# Patient Record
Sex: Male | Born: 1995 | Race: Black or African American | Hispanic: No | Marital: Single | Smoking: Current some day smoker
Health system: Southern US, Community
[De-identification: ages and names within clinical notes are randomized; demographics above are authoritative.]

## PROBLEM LIST (undated history)

## (undated) HISTORY — PX: KNEE SURGERY: SHX244

---

## 2017-09-11 ENCOUNTER — Emergency Department (HOSPITAL_COMMUNITY)
Admission: EM | Admit: 2017-09-11 | Discharge: 2017-09-12 | Disposition: A | Discharge status: Home / Self Care | Payer: PRIVATE HEALTH INSURANCE | Attending: Emergency Medicine | Admitting: Emergency Medicine

## 2017-09-11 ENCOUNTER — Encounter (HOSPITAL_COMMUNITY): Payer: Self-pay | Admitting: Emergency Medicine

## 2017-09-11 DIAGNOSIS — R1110 Vomiting, unspecified: Secondary | ICD-10-CM | POA: Diagnosis present

## 2017-09-11 DIAGNOSIS — R112 Nausea with vomiting, unspecified: Secondary | ICD-10-CM

## 2017-09-11 DIAGNOSIS — R197 Diarrhea, unspecified: Secondary | ICD-10-CM | POA: Diagnosis not present

## 2017-09-11 DIAGNOSIS — R200 Anesthesia of skin: Secondary | ICD-10-CM | POA: Diagnosis not present

## 2017-09-11 DIAGNOSIS — R531 Weakness: Secondary | ICD-10-CM | POA: Diagnosis not present

## 2017-09-11 LAB — COMPREHENSIVE METABOLIC PANEL
ALT: 15 U/L (ref 0–44)
ANION GAP: 8 (ref 5–15)
AST: 20 U/L (ref 15–41)
Albumin: 4.3 g/dL (ref 3.5–5.0)
Alkaline Phosphatase: 100 U/L (ref 38–126)
BUN: 13 mg/dL (ref 6–20)
CHLORIDE: 103 mmol/L (ref 98–111)
CO2: 26 mmol/L (ref 22–32)
Calcium: 9.4 mg/dL (ref 8.9–10.3)
Creatinine, Ser: 1.07 mg/dL (ref 0.61–1.24)
GFR calc Af Amer: >60 mL/min (ref >60)
GFR calc non Af Amer: >60 mL/min (ref >60)
GLUCOSE: 107 mg/dL — AB (ref 70–99)
POTASSIUM: 3.9 mmol/L (ref 3.5–5.1)
SODIUM: 137 mmol/L (ref 135–145)
Total Bilirubin: 1 mg/dL (ref 0.3–1.2)
Total Protein: 7 g/dL (ref 6.5–8.1)

## 2017-09-11 LAB — URINALYSIS, ROUTINE W REFLEX MICROSCOPIC
BILIRUBIN URINE: NEGATIVE
Glucose, UA: NEGATIVE mg/dL
HGB URINE DIPSTICK: NEGATIVE
KETONES UR: NEGATIVE mg/dL
Leukocytes, UA: NEGATIVE
NITRITE: NEGATIVE
Protein, ur: NEGATIVE mg/dL
SPECIFIC GRAVITY, URINE: 1.011 (ref 1.005–1.030)
pH: 8 (ref 5.0–8.0)

## 2017-09-11 LAB — CBC
HCT: 41 % (ref 39.0–52.0)
Hemoglobin: 13.7 g/dL (ref 13.0–17.0)
MCH: 29.6 pg (ref 26.0–34.0)
MCHC: 33.4 g/dL (ref 30.0–36.0)
MCV: 88.6 fL (ref 78.0–100.0)
Platelets: 213 10*3/uL (ref 150–400)
RBC: 4.63 MIL/uL (ref 4.22–5.81)
RDW: 11.8 % (ref 11.5–15.5)
WBC: 9.9 10*3/uL (ref 4.0–10.5)

## 2017-09-11 LAB — LIPASE, BLOOD: LIPASE: 29 U/L (ref 11–51)

## 2017-09-11 MED ORDER — ONDANSETRON 4 MG PO TBDP
4.0000 mg | ORAL_TABLET | Freq: Once | ORAL | Status: AC
  Administered 2017-09-11: 4 mg via ORAL
  Filled 2017-09-11: qty 1

## 2017-09-11 NOTE — ED Triage Notes (Signed)
BIB EMS from home, pt reports N/V/D onset this evening. Pt believes he has food poisoning.

## 2017-09-12 ENCOUNTER — Emergency Department (EMERGENCY_DEPARTMENT_HOSPITAL)
Admission: EM | Admit: 2017-09-12 | Discharge: 2017-09-13 | Disposition: A | Discharge status: Home / Self Care | Payer: PRIVATE HEALTH INSURANCE | Source: Home / Self Care | Attending: Emergency Medicine | Admitting: Emergency Medicine

## 2017-09-12 ENCOUNTER — Other Ambulatory Visit: Payer: Self-pay

## 2017-09-12 ENCOUNTER — Emergency Department (HOSPITAL_COMMUNITY): Payer: PRIVATE HEALTH INSURANCE

## 2017-09-12 ENCOUNTER — Encounter (HOSPITAL_COMMUNITY): Payer: Self-pay | Admitting: Emergency Medicine

## 2017-09-12 DIAGNOSIS — R200 Anesthesia of skin: Secondary | ICD-10-CM | POA: Insufficient documentation

## 2017-09-12 DIAGNOSIS — R251 Tremor, unspecified: Secondary | ICD-10-CM

## 2017-09-12 DIAGNOSIS — R531 Weakness: Secondary | ICD-10-CM | POA: Insufficient documentation

## 2017-09-12 DIAGNOSIS — R112 Nausea with vomiting, unspecified: Secondary | ICD-10-CM | POA: Diagnosis not present

## 2017-09-12 LAB — COMPREHENSIVE METABOLIC PANEL
ALBUMIN: 4.5 g/dL (ref 3.5–5.0)
ALT: 15 U/L (ref 0–44)
ANION GAP: 8 (ref 5–15)
AST: 20 U/L (ref 15–41)
Alkaline Phosphatase: 98 U/L (ref 38–126)
BUN: 10 mg/dL (ref 6–20)
CHLORIDE: 105 mmol/L (ref 98–111)
CO2: 26 mmol/L (ref 22–32)
Calcium: 9.6 mg/dL (ref 8.9–10.3)
Creatinine, Ser: 1.12 mg/dL (ref 0.61–1.24)
GFR calc Af Amer: >60 mL/min (ref >60)
GFR calc non Af Amer: >60 mL/min (ref >60)
GLUCOSE: 97 mg/dL (ref 70–99)
POTASSIUM: 3.9 mmol/L (ref 3.5–5.1)
SODIUM: 139 mmol/L (ref 135–145)
Total Bilirubin: 1.3 mg/dL — ABNORMAL HIGH (ref 0.3–1.2)
Total Protein: 7.1 g/dL (ref 6.5–8.1)

## 2017-09-12 LAB — I-STAT CHEM 8, ED
BUN: 13 mg/dL (ref 6–20)
CHLORIDE: 103 mmol/L (ref 98–111)
CREATININE: 1.1 mg/dL (ref 0.61–1.24)
Calcium, Ion: 1.24 mmol/L (ref 1.15–1.40)
Glucose, Bld: 93 mg/dL (ref 70–99)
HEMATOCRIT: 43 % (ref 39.0–52.0)
HEMOGLOBIN: 14.6 g/dL (ref 13.0–17.0)
POTASSIUM: 4 mmol/L (ref 3.5–5.1)
Sodium: 139 mmol/L (ref 135–145)
TCO2: 25 mmol/L (ref 22–32)

## 2017-09-12 LAB — DIFFERENTIAL
Abs Immature Granulocytes: 0 10*3/uL (ref 0.0–0.1)
BASOS ABS: 0 10*3/uL (ref 0.0–0.1)
Basophils Relative: 0 %
EOS ABS: 0 10*3/uL (ref 0.0–0.7)
Eosinophils Relative: 0 %
IMMATURE GRANULOCYTES: 0 %
LYMPHS PCT: 21 %
Lymphs Abs: 1.9 10*3/uL (ref 0.7–4.0)
Monocytes Absolute: 0.6 10*3/uL (ref 0.1–1.0)
Monocytes Relative: 7 %
Neutro Abs: 6.6 10*3/uL (ref 1.7–7.7)
Neutrophils Relative %: 72 %

## 2017-09-12 LAB — URINALYSIS, ROUTINE W REFLEX MICROSCOPIC
Bilirubin Urine: NEGATIVE
GLUCOSE, UA: NEGATIVE mg/dL
Hgb urine dipstick: NEGATIVE
Ketones, ur: NEGATIVE mg/dL
LEUKOCYTES UA: NEGATIVE
Nitrite: NEGATIVE
PH: 7 (ref 5.0–8.0)
Protein, ur: NEGATIVE mg/dL
Specific Gravity, Urine: 1.012 (ref 1.005–1.030)

## 2017-09-12 LAB — APTT: APTT: 33 s (ref 24–36)

## 2017-09-12 LAB — RAPID URINE DRUG SCREEN, HOSP PERFORMED
AMPHETAMINES: NOT DETECTED
BENZODIAZEPINES: NOT DETECTED
Barbiturates: NOT DETECTED
COCAINE: NOT DETECTED
OPIATES: NOT DETECTED
TETRAHYDROCANNABINOL: NOT DETECTED

## 2017-09-12 LAB — PROTIME-INR
INR: 1.11
Prothrombin Time: 14.2 seconds (ref 11.4–15.2)

## 2017-09-12 LAB — CBC
HEMATOCRIT: 42 % (ref 39.0–52.0)
Hemoglobin: 14 g/dL (ref 13.0–17.0)
MCH: 29.7 pg (ref 26.0–34.0)
MCHC: 33.3 g/dL (ref 30.0–36.0)
MCV: 89.2 fL (ref 78.0–100.0)
PLATELETS: 230 10*3/uL (ref 150–400)
RBC: 4.71 MIL/uL (ref 4.22–5.81)
RDW: 11.8 % (ref 11.5–15.5)
WBC: 9.2 10*3/uL (ref 4.0–10.5)

## 2017-09-12 LAB — I-STAT TROPONIN, ED: Troponin i, poc: 0.01 ng/mL (ref 0.00–0.08)

## 2017-09-12 MED ORDER — SODIUM CHLORIDE 0.9 % IV BOLUS
1000.0000 mL | Freq: Once | INTRAVENOUS | Status: AC
  Administered 2017-09-12: 1000 mL via INTRAVENOUS

## 2017-09-12 MED ORDER — DIPHENHYDRAMINE HCL 50 MG/ML IJ SOLN
25.0000 mg | Freq: Once | INTRAMUSCULAR | Status: AC
  Administered 2017-09-12: 25 mg via INTRAVENOUS
  Filled 2017-09-12: qty 1

## 2017-09-12 MED ORDER — KETOROLAC TROMETHAMINE 15 MG/ML IJ SOLN
15.0000 mg | Freq: Once | INTRAMUSCULAR | Status: AC
  Administered 2017-09-12: 15 mg via INTRAVENOUS
  Filled 2017-09-12: qty 1

## 2017-09-12 MED ORDER — ONDANSETRON 4 MG PO TBDP: 4.0000 mg | ORAL_TABLET | Freq: Three times a day (TID) | ORAL | 0 refills | Status: DC | PRN

## 2017-09-12 MED ORDER — METOCLOPRAMIDE HCL 5 MG/ML IJ SOLN
10.0000 mg | Freq: Once | INTRAMUSCULAR | Status: AC
  Administered 2017-09-12: 10 mg via INTRAVENOUS
  Filled 2017-09-12: qty 2

## 2017-09-12 NOTE — ED Notes (Signed)
Patient's girlfriend comes out to the desk in triage, stating patient is now having trouble speaking and moving his arms. Patient seen texting in chair at this time, while also talking to his girlfriend. Quick look EDP made aware.

## 2017-09-12 NOTE — ED Provider Notes (Signed)
[facility] EMERGENCY DEPARTMENT Provider Note   CSN: [id number] Arrival date & time: 09/12/17  1557     History   Chief Complaint Chief Complaint  Patient presents with  . Shaking    HPI Bryan Mendez is a 21 y.o. male.  HPI  22 year old male presents for shaking, weakness and numbness on the right side.  He states he started developing vomiting and diarrhea yesterday and came to the emergency department.  All symptoms stopped prior to coming here.  He was prescribed ondansetron which he took earlier today.  At around 330 he started to feel very shaky like he was having chills.  Asked for the [facility] to be turned off but still is having shakes.  So he started to come here.  Then developed right sided numbness and weakness to his leg and arm.  Just after getting here he is also developed trouble getting out his words.  He states is like he just has a hard time speaking the correct word.  When asked, states he has a slight frontal headache that he describes as a 6 or 7 out of 10.  This also just recently started.  No fevers.  History reviewed. No pertinent past medical history.  There are no active problems to display for this patient.   Past Surgical History:  Procedure Laterality Date  . KNEE SURGERY Right         Home Medications    Prior to Admission medications   Medication Sig Start Date End Date Taking? Authorizing Provider  ondansetron (ZOFRAN ODT) 4 MG disintegrating tablet Take 1 tablet (4 mg total) by mouth every 8 (eight) hours as needed for nausea or vomiting. 09/12/17   Elson Areas, PA-C    Family History No family history on file.  Social History Social History   Tobacco Use  . Smoking status: Never Smoker  . Smokeless tobacco: Never Used  Substance Use Topics  . Alcohol use: Not Currently  . Drug use: Not Currently     Allergies   Patient has no known allergies.   Review of Systems Review of Systems  Constitutional: Negative  for fever.  Eyes: Negative for visual disturbance.  Cardiovascular: Negative for chest pain.  Gastrointestinal: Positive for diarrhea and vomiting. Negative for abdominal pain.  Neurological: Positive for weakness, numbness and headaches.  All other systems reviewed and are negative.    Physical Exam Updated Vital Signs BP 106/68   Pulse 62   Temp 98.8 F (37.1 C) (Oral)   Resp (!) 22   SpO2 100%   Physical Exam  Constitutional: He is oriented to person, place, and time. He appears well-developed and well-nourished. No distress.  Sitting up, resting comfortably  HENT:  Head: Normocephalic and atraumatic.  Right Ear: External ear normal.  Left Ear: External ear normal.  Nose: Nose normal.  Eyes: Pupils are equal, round, and reactive to light. EOM are normal. Right eye exhibits no discharge. Left eye exhibits no discharge.  Neck: Normal range of motion. Neck supple.  Cardiovascular: Normal rate, regular rhythm and normal heart sounds.  Pulmonary/Chest: Effort normal and breath sounds normal.  Abdominal: Soft. There is no tenderness.  Musculoskeletal: He exhibits no edema.  Neurological: He is alert and oriented to person, place, and time.  Patient has clear speech.  When he is describing the difficulty speaking, he starts to stutter.  Later on he is speaking normally again.  The patient reports decreased sensation to the right side  of his face compared to the left.  At first he reports no sensation but it is noted that his right face and eye is twitching involuntarily when lightly brushed.  Left arm and leg have 5/5 strength and normal sensation.  Decreased sensation diffusely in his right upper and lower extremities.  He has a very inconsistent strength exam.  He moves his arm extremely slowly when trying to do finger-to-nose but he can move his arm and lift his arm.  At other times he drops his arm quickly to the bed secondary to after being let go of.  Right lower extremity.   Appears to have difficulty being raised off the bed but he also shows no effort in pushing down on his left lower extremity when trying to raise his right leg.  Skin: Skin is warm and dry. He is not diaphoretic.  Nursing note and vitals reviewed.    ED Treatments / Results  Labs (all labs ordered are listed, but only abnormal results are displayed) Labs Reviewed  COMPREHENSIVE METABOLIC PANEL - Abnormal; Notable for the following components:      Result Value   Total Bilirubin 1.3 (*)    All other components within normal limits  PROTIME-INR  APTT  CBC  DIFFERENTIAL  RAPID URINE DRUG SCREEN, HOSP PERFORMED  URINALYSIS, ROUTINE W REFLEX MICROSCOPIC  ETHANOL  I-STAT CHEM 8, ED  I-STAT TROPONIN, ED    EKG EKG Interpretation  Date/Time:  Monday September 12 2017 19:24:39 EDT Ventricular Rate:  76 PR Interval:    QRS Duration: 95 QT Interval:  358 QTC Calculation: 403 R Axis:   91 Text Interpretation:  Sinus rhythm Borderline right axis deviation ST elev, probable normal early repol pattern No old tracing to compare Confirmed by Pricilla Loveless [phone]) on 09/12/2017 7:53:33 PM   Radiology Ct Head Wo Contrast  Result Date: 09/12/2017 CLINICAL DATA:  Right arm and leg numbness with difficulty speaking. Whole-body shaking. EXAM: CT HEAD WITHOUT CONTRAST TECHNIQUE: Contiguous axial images were obtained from the base of the skull through the vertex without intravenous contrast. COMPARISON:  None. FINDINGS: Brain: No evidence of infarction, hemorrhage, hydrocephalus, extra-axial collection or mass lesion/mass effect. Vascular: No hyperdense vessel or unexpected calcification. Skull: Normal. Negative for fracture or focal lesion. Sinuses/Orbits: No acute finding. IMPRESSION: Negative head CT. Electronically Signed   By: Marnee Spring M.D.   On: 09/12/2017 17:30   Mr Brain Wo Contrast  Result Date: 09/12/2017 CLINICAL DATA:  Right-sided weakness. EXAM: MRI HEAD WITHOUT CONTRAST  TECHNIQUE: Multiplanar, multiecho pulse sequences of the brain and surrounding structures were obtained without intravenous contrast. COMPARISON:  Head CT earlier today FINDINGS: Brain: No infarction, hemorrhage, hydrocephalus, extra-axial collection or mass lesion. No white matter disease. Vascular: Major flow voids are preserved Skull and upper cervical spine: No focal marrow lesion. Sinuses/Orbits: Negative. IMPRESSION: Normal MRI of the brain. Electronically Signed   By: Marnee Spring M.D.   On: 09/12/2017 21:01    Procedures Procedures (including critical care time)  Medications Ordered in ED Medications  metoCLOPramide (REGLAN) injection 10 mg (10 mg Intravenous Given 09/12/17 2010)  diphenhydrAMINE (BENADRYL) injection 25 mg (25 mg Intravenous Given 09/12/17 2010)  ketorolac (TORADOL) 15 MG/ML injection 15 mg (15 mg Intravenous Given 09/12/17 2011)  sodium chloride 0.9 % bolus 1,000 mL (1,000 mLs Intravenous New Bag/Given 09/12/17 2140)     Initial Impression / Assessment and Plan / ED Course  I have reviewed the triage vital signs and the nursing notes.  Pertinent labs & imaging results that were available during my care of the patient were reviewed by me and considered in my medical decision making (see chart for details).     I think the patient's right-sided weakness is not from an acute CNS emergency.  But it seems functional.  Given there is an associated headache, complicated migraine could be a component and so I gave him treatment for this.  He is feeling much better.  He is stating that he can move his arms and legs better although not fully back to normal.  However he is also able to ambulate without difficulty.  Discussed over the phone with family who is still quite concerned about the patient, although I do not think he has a a neurologic emergency.  I highly doubt meningitis given no fever, elevated WBC or neck stiffness.  Doubt other acute and infectious process.  Discussed  with neurology, Dr. Otelia Limes, who discussed with patient and family.  Will add on MRI C-spine to his prior negative MRI brain.  If this is negative he should be stable for discharge home.  Care transferred to Dr. Rhunette Croft.  Final Clinical Impressions(s) / ED Diagnoses   Final diagnoses:  None    ED Discharge Orders    None       Pricilla Loveless, MD 09/12/17 2356

## 2017-09-12 NOTE — Discharge Instructions (Signed)
Return if any problems.

## 2017-09-12 NOTE — ED Notes (Signed)
RN observed pt walking w/ NT, no problems noted, no c/o dizziness, good gate.

## 2017-09-12 NOTE — ED Provider Notes (Signed)
Patient placed in Quick Look pathway, seen and evaluated   Chief Complaint:   HPI:   22 year old male presents with shaking, SOB, and right sided weakness. He states that he was seen in the ED yesterday for N/V/D, shaking, SOB. He tolerated PO and was discharged. Labs and urine were normal. This morning he had chills and thought it was because of the [facility]. The [facility] was turned off but he was still shaking and had chills. He was also SOB but this is intermittent. He was told yesterday that his symptoms were from anxiety. Today he started to have right arm and right leg weakness. He states he cannot feel his arm or leg either. This started 1 hour ago. No headache, vision changes, LOC.  ROS: +right arm and leg weakness and paresthesias  Physical Exam:   Gen: No distress, anxious  Neuro: Awake and Alert  Skin: Warm    Focused Exam: Lying on stretcher in NAD. GCS 15. Speaks in a clear voice. Cranial nerves II through XII grossly intact. 5/5 in left upper extremity. 4/5 in right. He will not perform finger to nose on his right side. Arm was passively flexed and dropped and the patient had control over his arm and gently lowered it to his lap. Subjective numbness of the right arm and leg in no particular distribution. No dysmetria on the left. Able to bear weight and ambulate but stumbles and states he cannot lift his right leg.    Initiation of care has begun. The patient has been counseled on the process, plan, and necessity for staying for the completion/evaluation, and the remainder of the medical screening examination    Bethel Born, PA-C 09/12/17 1651    Alvira Monday, MD 09/14/17 1004

## 2017-09-12 NOTE — ED Triage Notes (Signed)
Patient to ED c/o shaking and feeling like his whole body felt numb earlier today. Was seen yesterday for possible food poisoning, but denies N/V today. He also c/o difficulty catching his breath. Denies cough or CP. Resp e/u.

## 2017-09-12 NOTE — ED Provider Notes (Signed)
[facility] EMERGENCY DEPARTMENT Provider Note   CSN: [id number] Arrival date & time: 09/11/17  2114     History   Chief Complaint Chief Complaint  Patient presents with  . Emesis  . Diarrhea    HPI Bryan Mendez is a 22 y.o. male.  The history is provided by the patient. No language interpreter was used.  Emesis   This is a new problem. The current episode started 6 to 12 hours ago. The problem occurs 2 to 4 times per day. The problem has not changed since onset.There has been no fever. Associated symptoms include diarrhea.  Diarrhea   Associated symptoms include vomiting.  Pt complains of vomiting and diarrhea.  Pt reports he became sick after eating a hamburger.   History reviewed. No pertinent past medical history.  There are no active problems to display for this patient.   Past Surgical History:  Procedure Laterality Date  . KNEE SURGERY Right         Home Medications    Prior to Admission medications   Medication Sig Start Date End Date Taking? Authorizing Provider  ondansetron (ZOFRAN ODT) 4 MG disintegrating tablet Take 1 tablet (4 mg total) by mouth every 8 (eight) hours as needed for nausea or vomiting. 09/12/17   Elson Areas, PA-C    Family History No family history on file.  Social History Social History   Tobacco Use  . Smoking status: Never Smoker  . Smokeless tobacco: Never Used  Substance Use Topics  . Alcohol use: Not Currently  . Drug use: Not Currently     Allergies   Patient has no known allergies.   Review of Systems Review of Systems  Gastrointestinal: Positive for diarrhea and vomiting.  All other systems reviewed and are negative.    Physical Exam Updated Vital Signs BP 118/83 (BP Location: Right Arm)   Pulse 81   Temp 98.5 F (36.9 C) (Oral)   Resp 14   Ht 5\' 10"  (1.778 m)   Wt 68 kg   SpO2 98%   BMI 21.52 kg/m   Physical Exam  Constitutional: He appears well-developed and  well-nourished.  HENT:  Head: Normocephalic and atraumatic.  Eyes: Conjunctivae are normal.  Neck: Neck supple.  Cardiovascular: Normal rate and regular rhythm.  No murmur heard. Pulmonary/Chest: Effort normal and breath sounds normal. No respiratory distress.  Abdominal: Soft. There is no guarding.  Musculoskeletal: He exhibits no edema.  Neurological: He is alert.  Skin: Skin is warm and dry.  Psychiatric: He has a normal mood and affect.  Nursing note and vitals reviewed.    ED Treatments / Results  Labs (all labs ordered are listed, but only abnormal results are displayed) Labs Reviewed  COMPREHENSIVE METABOLIC PANEL - Abnormal; Notable for the following components:      Result Value   Glucose, Bld 107 (*)    All other components within normal limits  LIPASE, BLOOD  CBC  URINALYSIS, ROUTINE W REFLEX MICROSCOPIC    EKG None  Radiology No results found.  Procedures Procedures (including critical care time)  Medications Ordered in ED Medications  ondansetron (ZOFRAN-ODT) disintegrating tablet 4 mg (4 mg Oral Given 09/11/17 2325)     Initial Impression / Assessment and Plan / ED Course  I have reviewed the triage vital signs and the nursing notes.  Pertinent labs & imaging results that were available during my care of the patient were reviewed by me and considered in my medical decision  making (see chart for details).     MDM   Pt reports relief after zofran.  Pt taking oral fluids without difficulty.    Final Clinical Impressions(s) / ED Diagnoses   Final diagnoses:  Nausea vomiting and diarrhea    ED Discharge Orders         Ordered    ondansetron (ZOFRAN ODT) 4 MG disintegrating tablet  Every 8 hours PRN     09/12/17 0023        An After Visit Summary was printed and given to the patient.    Elson Areas, PA-C 09/12/17 Madalyn Rob, MD 09/12/17 [phone]

## 2017-09-12 NOTE — ED Notes (Signed)
When asked if he had pain he stated no, when asked about a headache he stated his pain was a 6.

## 2017-09-12 NOTE — Consult Note (Signed)
NEURO HOSPITALIST CONSULT NOTE   Requestig physician: Dr. Criss Alvine  Reason for Consult: Right sided weakness  History obtained from:  Patient, Family and Chart  HPI:                                                                                                                                          Bryan Mendez is an 22 y.o. male who initially presented to the ED yesterday with N/V/D; symptoms were convincing for food poisoning according to the patient, since symptoms began after eating a hamburger. He was discharged home after tolerating PO and with normal labs. He returned today with new complaints of shaking, chills and right arm and leg weakness with numbness, stating he could not feel his right arm or leg either. Later clarified that he had right sided weakness. N/V had resolved, but the patient had new symptom of difficulty catching his breath. He also complained of a 6/10 headache.   MRI brain was normal. The patient continues to have symptoms. Neurology was consulted to further assess.   History reviewed. No pertinent past medical history.  Past Surgical History:  Procedure Laterality Date  . KNEE SURGERY Right     No family history on file.            Social History:  reports that he has never smoked. He has never used smokeless tobacco. He reports that he drank alcohol. He reports that he has current or past drug history.  No Known Allergies  HOME MEDICATIONS:                                                                                                                     Zofran   ROS:  Currently with no headache, neck pain, chest pain, abdominal pain. Had some right arm pain with movement earlier. No vision loss.    Blood pressure 106/68, pulse 62, temperature 98.8 F (37.1 C), temperature source Oral, resp. rate  (!) 22, SpO2 100 %.   General Examination:                                                                                                       Physical Exam  HEENT-  [place]/AT    Lungs-Respirations unlabored Abdomen- Nondistended Extremities- Warm and well perfused; no edema.  Neurological Examination Mental Status: Drowsy appearing, but answers all questions and follows all commands. Speech is fluent when distracted, but with stuttering quality when speech is focused on by examiner. Naming intact but with delays in answering after presented with object.  Cranial Nerves: II: Visual fields intact with no extinction to DSS. PERRL.  III,IV, VI: EOMI without nystagmus. No ptosis.  V,VII: Smile symmetric, facial temp sensation decreased on right VIII: hearing intact to voice IX,X: No hypophonia or hoarseness XI: Head at midline. No asymmetry.  XII: midline tongue extension Motor: RUE: 4/5 grip and biceps, otherwise 5/5. Had giveway weakness with abduction and adduction at right shoulder, which became less consistent when applied force/direction rapidly changed by examiner.  LUE: 5/5 proximal and distal RLE: Some giveway weakness with knee extension and poor cooperation with ADF, improved with coaching. No drift.  LLE: 5/5 proximal and distal without drift.  Normal tone throughout; no atrophy noted Sensory: Decreased temp sensation RUE and RLE. No extinction to DSS.  Deep Tendon Reflexes: 3+ brachioradialis, biceps and patellae bilaterally. 2+ achilles bilaterally. Plantars: Right: downgoing   Left: downgoing Cerebellar: No ataxia with FNF bilaterally, however with slow FNF on right in conjunction with labored affect.  Left H-S normal. Unable to initiate H-S on right which is incongruent with ability to elevate RLE antigravity and resist examiner with 4-5/5 strength.  Gait: Deferred. Ability to ambulate has been observed by ED staff.    Lab Results: Basic Metabolic Panel: Recent Labs  Lab  09/11/17 2152 09/12/17 1658 09/12/17 1719  NA 137 139 139  K 3.9 3.9 4.0  CL 103 105 103  CO2 26 26  --   GLUCOSE 107* 97 93  BUN 13 10 13   CREATININE 1.07 1.12 1.10  CALCIUM 9.4 9.6  --     CBC: Recent Labs  Lab 09/11/17 2152 09/12/17 1658 09/12/17 1719  WBC 9.9 9.2  --   NEUTROABS  --  6.6  --   HGB 13.7 14.0 14.6  HCT 41.0 42.0 43.0  MCV 88.6 89.2  --   PLT 213 230  --     Cardiac Enzymes: No results for input(s): CKTOTAL, CKMB, CKMBINDEX, TROPONINI in the last 168 hours.  Lipid Panel: No results for input(s): CHOL, TRIG, HDL, CHOLHDL, VLDL, LDLCALC in the last 168 hours.  Imaging: Ct Head Wo Contrast  Result Date: 09/12/2017 CLINICAL DATA:  Right arm and leg numbness with difficulty speaking. Whole-body shaking. EXAM: CT HEAD WITHOUT CONTRAST TECHNIQUE: Contiguous  axial images were obtained from the base of the skull through the vertex without intravenous contrast. COMPARISON:  None. FINDINGS: Brain: No evidence of infarction, hemorrhage, hydrocephalus, extra-axial collection or mass lesion/mass effect. Vascular: No hyperdense vessel or unexpected calcification. Skull: Normal. Negative for fracture or focal lesion. Sinuses/Orbits: No acute finding. IMPRESSION: Negative head CT. Electronically Signed   By: Marnee Spring M.D.   On: 09/12/2017 17:30   Mr Brain Wo Contrast  Result Date: 09/12/2017 CLINICAL DATA:  Right-sided weakness. EXAM: MRI HEAD WITHOUT CONTRAST TECHNIQUE: Multiplanar, multiecho pulse sequences of the brain and surrounding structures were obtained without intravenous contrast. COMPARISON:  Head CT earlier today FINDINGS: Brain: No infarction, hemorrhage, hydrocephalus, extra-axial collection or mass lesion. No white matter disease. Vascular: Major flow voids are preserved Skull and upper cervical spine: No focal marrow lesion. Sinuses/Orbits: Negative. IMPRESSION: Normal MRI of the brain. Electronically Signed   By: Marnee Spring M.D.   On: 09/12/2017  21:01    Assessment: 22 year old male with right sided subjective weakness and sensory numbness 1. Several inconsistent/non-physiological findings on exam including giveway weakness and discrepancy between cerebellar and motor testing, as well as stuttering quality to speech which is distractable and labored affect when performing portions of the exam on the right.  2. Also has been observed by EDP and RN staff to have normal speech, motor and ambulatory function when casually observed, relative to deficits becoming apparent when specifically tested for during exams.  3. MRI brain normal. I have personally reviewed the images.  4. Overall impression is that presentation is most likely secondary to anxiety versus conversion disorder or embellished symptoms for secondary gain.   Recommendations: 1. MRI cervical spine. This test is likely to be of low yield. Thoracic imaging not indicated as he has both upper and lower extremity motor symptoms.  2. Ambulate patient with PT.  3. If MRI cervical spine is negative, the patient and family have been reassured that most likely symptoms will resolve on their own. However, if symptoms worsen at home, or if new symptoms occur, he should be re-evaluated. Patient and family expressed understanding and agreement with the plan.  4. Discussed with Dr. Criss Alvine.   Electronically signed: Dr. Caryl Pina 09/12/2017, 11:07 PM

## 2017-09-12 NOTE — ED Notes (Signed)
ED Provider at bedside. 

## 2017-09-13 ENCOUNTER — Ambulatory Visit (INDEPENDENT_AMBULATORY_CARE_PROVIDER_SITE_OTHER): Payer: PRIVATE HEALTH INSURANCE | Admitting: Diagnostic Neuroimaging

## 2017-09-13 ENCOUNTER — Encounter: Payer: Self-pay | Admitting: Diagnostic Neuroimaging

## 2017-09-13 ENCOUNTER — Emergency Department (HOSPITAL_COMMUNITY): Payer: PRIVATE HEALTH INSURANCE

## 2017-09-13 VITALS — BP 111/74 | HR 68 | Ht 70.0 in | Wt 146.2 lb

## 2017-09-13 DIAGNOSIS — R112 Nausea with vomiting, unspecified: Secondary | ICD-10-CM | POA: Diagnosis not present

## 2017-09-13 DIAGNOSIS — R4689 Other symptoms and signs involving appearance and behavior: Secondary | ICD-10-CM | POA: Diagnosis not present

## 2017-09-13 NOTE — Progress Notes (Signed)
[facility]  PATIENT: Bryan Mendez DOB: 1995/05/27  REFERRING CLINICIAN: ER  HISTORY FROM: patient  REASON FOR VISIT: new consult    HISTORICAL  CHIEF COMPLAINT:  Chief Complaint  Patient presents with  . New Patient (Initial Visit)  . ED Referral - L sided weakness    Parents with pt, he is college student at [employer].  Having episodes weakness, speech, sob, nonresponsive per friends    HISTORY OF PRESENT ILLNESS:   22 year old male here for evaluation of seizure.  09/11/2017 patient was at home, had nausea, vomiting, diarrhea, felt weak, collapsed, eyes rolled back and had shaking.  He was short of breath.  He had brief loss of consciousness for a few seconds.  Patient went to the emergency room for evaluation.  He was mainly evaluated for his GI symptoms, thought to be possible food poisoning, had lab testing and was discharged home.  09/12/2017 patient developed chills, shortness of breath, and then had right arm and right leg shaking.  Patient went to the emergency room again for evaluation.  Patient had neurology consultation, an MRI was obtained which was normal.  Possibility of nonorganic etiology and symptoms was raised by neurologist.  Anxiety or conversion disorder was considered as a possibility. Patient was then discharged home.  Today patient feels back to baseline.  He denies any prodromal triggering or aggravating factors.  No history of meningitis or encephalitis.  No prior traumatic brain injury.  Patient's mother has history of seizure disorder including petit mall and grand mal seizures.    REVIEW OF SYSTEMS: Full 14 system review of systems performed and negative with exception of: Headache numbness weakness slurred speech dizziness tremor sleepiness shortness of breath feeling hot increased thirst allergies fatigue blurred vision.  ALLERGIES: No Known Allergies  HOME MEDICATIONS: Outpatient Medications Prior to Visit  Medication Sig  Dispense Refill  . cetirizine-pseudoephedrine (ZYRTEC-D) 5-120 MG tablet Take 1 tablet by mouth daily as needed for allergies.    Marland Kitchen UNABLE TO FIND Med Name: whey protein powder    . ondansetron (ZOFRAN ODT) 4 MG disintegrating tablet Take 1 tablet (4 mg total) by mouth every 8 (eight) hours as needed for nausea or vomiting. (Patient not taking: Reported on 09/13/2017) 20 tablet 0   No facility-administered medications prior to visit.     PAST MEDICAL HISTORY: No past medical history on file.  PAST SURGICAL HISTORY: Past Surgical History:  Procedure Laterality Date  . KNEE SURGERY Right     FAMILY HISTORY: Family History  Problem Relation Age of Onset  . Epilepsy Mother   . Hypertension Father   . Diabetes Maternal Grandmother     SOCIAL HISTORY: Social History   Socioeconomic History  . Marital status: Single    Spouse name: Not on file  . Number of children: Not on file  . Years of education: Not on file  . Highest education level: Not on file  Occupational History  . Not on file  Social Needs  . Financial resource strain: Not on file  . Food insecurity:    Worry: Not on file    Inability: Not on file  . Transportation needs:    Medical: Not on file    Non-medical: Not on file  Tobacco Use  . Smoking status: Never Smoker  . Smokeless tobacco: Never Used  Substance and Sexual Activity  . Alcohol use: Never    Frequency: Never  . Drug use: Never  . Sexual activity: Not  on file  Lifestyle  . Physical activity:    Days per week: Not on file    Minutes per session: Not on file  . Stress: Not on file  Relationships  . Social connections:    Talks on phone: Not on file    Gets together: Not on file    Attends religious service: Not on file    Active member of club or organization: Not on file    Attends meetings of clubs or organizations: Not on file    Relationship status: Not on file  . Intimate partner violence:    Fear of current or ex partner: Not on  file    Emotionally abused: Not on file    Physically abused: Not on file    Forced sexual activity: Not on file  Other Topics Concern  . 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  Works at [employer].  Is student at [employer].       PHYSICAL EXAM  GENERAL EXAM/CONSTITUTIONAL: Vitals:  Vitals:   09/13/17 1247  BP: 111/74  Pulse: 68  Weight: 146 lb 3.2 oz (66.3 kg)  Height: 5\' 10"  (1.778 m)     Body mass index is 20.98 kg/m. Wt Readings from Last 3 Encounters:  09/13/17 146 lb 3.2 oz (66.3 kg) (35 %, Z= -0.39)*  09/11/17 150 lb (68 kg) (41 %, Z= -0.22)*   * Growth percentiles are based on CDC (Boys, 2-20 Years) data.     Patient is in no distress; well developed, nourished and groomed; neck is supple  CARDIOVASCULAR:  Examination of carotid arteries is normal; no carotid bruits  Regular rate and rhythm, no murmurs  Examination of peripheral vascular system by observation and palpation is normal  EYES:  Ophthalmoscopic exam of optic discs and posterior segments is normal; no papilledema or hemorrhages  Visual Acuity Screening   Right eye Left eye Both eyes  Without correction:     With correction: 20/50 20/40      MUSCULOSKELETAL:  Gait, strength, tone, movements noted in Neurologic exam below  NEUROLOGIC: MENTAL STATUS:  No flowsheet data found.  awake, alert, oriented to person, place and time  recent and remote memory intact  normal attention and concentration  language fluent, comprehension intact, naming intact  fund of knowledge appropriate  CRANIAL NERVE:   2nd - no papilledema on fundoscopic exam  2nd, 3rd, 4th, 6th - pupils equal and reactive to light, visual fields full to confrontation, extraocular muscles intact, no nystagmus  5th - facial sensation symmetric  7th - facial strength symmetric  8th - hearing intact  9th - palate elevates symmetrically, uvula  midline  11th - shoulder shrug symmetric  12th - tongue protrusion midline  MOTOR:   normal bulk and tone, full strength in the BUE, BLE  SENSORY:   normal and symmetric to light touch, temperature, vibration  COORDINATION:   finger-nose-finger, fine finger movements normal  REFLEXES:   deep tendon reflexes present and symmetric  GAIT/STATION:   narrow based gait; romberg is negative     DIAGNOSTIC DATA (LABS, IMAGING, TESTING) - I reviewed patient records, labs, notes, testing and imaging myself where available.  Lab Results  Component Value Date   WBC 9.2 09/12/2017   HGB 14.6 09/12/2017   HCT 43.0 09/12/2017   MCV 89.2 09/12/2017   PLT 230 09/12/2017      Component Value  Date/Time   NA 139 09/12/2017 1719   K 4.0 09/12/2017 1719   CL 103 09/12/2017 1719   CO2 26 09/12/2017 1658   GLUCOSE 93 09/12/2017 1719   BUN 13 09/12/2017 1719   CREATININE 1.10 09/12/2017 1719   CALCIUM 9.6 09/12/2017 1658   PROT 7.1 09/12/2017 1658   ALBUMIN 4.5 09/12/2017 1658   AST 20 09/12/2017 1658   ALT 15 09/12/2017 1658   ALKPHOS 98 09/12/2017 1658   BILITOT 1.3 (H) 09/12/2017 1658   GFRNONAA >60 09/12/2017 1658   GFRAA >60 09/12/2017 1658   No results found for: CHOL, HDL, LDLCALC, LDLDIRECT, TRIG, CHOLHDL No results found for: UEAV4U No results found for: VITAMINB12 No results found for: TSH   09/13/17 MRI cervical [I reviewed images myself and agree with interpretation. -VRP]  1. No fracture, malalignment or acute osseous process. 2. No canal stenosis.  C5-6 annular fissure. 3. Mild RIGHT C4-5 neural foraminal narrowing.  09/12/17 MRI brain [I reviewed images myself and agree with interpretation. -VRP]  - Normal MRI of the brain.    ASSESSMENT AND PLAN  22 y.o. year old male here with probable syncopal attack on 09/11/2017 associated with nausea, vomiting and diarrhea (possible viral gastroenteritis).  Then with transient right-sided shaking and numbness the  next day, of unclear etiology.  Could represent partial seizure, migraine phenomenon or conversion reaction.   Ddx: seizure, migraine or conversion reaction   1. Spell of abnormal behavior       PLAN:  - check EEG - monitor symptoms  Orders Placed This Encounter  Procedures  . EEG adult   Return in about 4 months (around 01/13/2018).  I reviewed images, labs, notes, records myself. I summarized findings and reviewed with patient, for this high risk condition (transient neurologic symptoms; ? seizure) requiring high complexity decision making.     Suanne Marker, MD 09/13/2017, 1:10 PM Certified in Neurology, Neurophysiology and Neuroimaging  [facility] [address], [address] [place], [place] [address] [phone]

## 2017-09-13 NOTE — ED Provider Notes (Addendum)
  Physical Exam  BP 106/68   Pulse 62   Temp 98.8 F (37.1 C) (Oral)   Resp (!) 22   SpO2 100%   Physical Exam  ED Course/Procedures     Procedures  MDM   Assuming care of patient from [facility]   Patient in the ED for L sided weakness. Workup thus far shows neg MRI brain and CT head.  Concerning findings are as following: None Important pending results are: MRI cervical spine.   According to Dr. Lawrence Marseilles, plan is to d/c with Neuro f/u if MRI is neg. Neurology service has seen the patient and has requested MRI c-spine. No clinical concerns for conditions that can cause ascending weakness.    Patient had no complains, no concerns from the nursing side. Will continue to monitor.    Derwood Kaplan, MD 09/13/17 0024  6:07 AM  MRI of the cervical spine is negative. I reassessed the patient and went over the results.  Patient's parents are at the bedside.  I performed a repeat neurologic exam in front of the patient's family. Cranial nerves II through XII are intact.  Patient has no sensory deficits over the upper and lower extremities.  His motor strength is 4+ out of 5 for bilateral upper and lower extremities.  Patient has mild grip weakness over his right hand and he is able to ambulate without any ataxia.  Patient denies any fevers.  He had nausea, vomiting and diarrhea about 2 days ago which have now resolved.  At no point did patient have bilateral lower extremity paresthesias or weakness which could be hallmark of syndromes associated with ascending paralysis or myelopathy.  We went over patient's work-up in the ED with the family. I informed him that that all the lab work-up was normal, and so was CT scan of the head, MRI of the brain and MRI of the neck. We also discussed with the family that neurologist was consulted and they had independently assessed the patient, and their recommendations were followed.  Finally informed patient that although the emergent  work-up is negative, it does not mean that Arben does not have other nonemergent etiologies leading to his condition.  We discussed the importance of close follow-up with neurologist for further evaluation and possible diagnostic work-up.  I also discussed with the family that certain conditions can have waxing and waning symptoms and evolving symptoms, therefore it would be helpful if they will stay with [facility] for the next couple of days and bring him to the ER if he has any new symptoms or progression of his existing symptoms.  Family does appear understandably anxious, given that they live quite a distance away from [place].    Strict ER return precautions have been discussed, and patient is agreeing with the plan and is comfortable with the workup done and the recommendations from the ER.  I will call Neurology to see if patient can get expedited follow up.    Derwood Kaplan, MD 09/13/17 [phone]

## 2017-09-13 NOTE — ED Notes (Signed)
Patient in MRI 

## 2017-09-13 NOTE — Discharge Instructions (Addendum)
All the results in the ER are normal, labs and imaging. We are not sure what is causing your symptoms. The workup in the ER is not complete, and is limited to screening for life threatening and emergent conditions only, so please see the Neurologist for complete workup.  Return to the ER if you start noticing progressing weakness or your extremities or difficulty in breathing.

## 2017-09-13 NOTE — ED Notes (Signed)
MD speaking with family

## 2017-09-14 ENCOUNTER — Ambulatory Visit (INDEPENDENT_AMBULATORY_CARE_PROVIDER_SITE_OTHER): Payer: PRIVATE HEALTH INSURANCE | Admitting: Diagnostic Neuroimaging

## 2017-09-14 ENCOUNTER — Other Ambulatory Visit: Payer: PRIVATE HEALTH INSURANCE

## 2017-09-14 DIAGNOSIS — R2990 Unspecified symptoms and signs involving the nervous system: Secondary | ICD-10-CM

## 2017-09-14 DIAGNOSIS — R4689 Other symptoms and signs involving appearance and behavior: Secondary | ICD-10-CM

## 2017-09-15 ENCOUNTER — Encounter: Payer: Self-pay | Admitting: Diagnostic Neuroimaging

## 2017-09-15 NOTE — Procedures (Signed)
   [facility]  EEG (ELECTROENCEPHALOGRAM) REPORT   STUDY DATE: 09/14/17 PATIENT NAME: Bryan Mendez DOB: [birth year] MRN: [id number]  ORDERING CLINICIAN: Joycelyn Schmid, MD   TECHNOLOGIST: Charlett Blake TECHNIQUE: Electroencephalogram was recorded utilizing standard 10-20 system of lead placement and reformatted into average and bipolar montages.  RECORDING TIME: 22 minutes  ACTIVATION: hyperventilation and photic stimulation  CLINICAL INFORMATION: 22 year old male with transient shaking and numbness.  FINDINGS: Posterior dominant background rhythms, which attenuate with eye opening, ranging 9-10 hertz and 65-70 microvolts. No focal, lateralizing, epileptiform activity or seizures are seen. Patient recorded in the awake and drowsy state. EKG channel shows regular rhythm of 65-70 beats per minute.   IMPRESSION:   Normal EEG in the awake and drowsy states.    INTERPRETING PHYSICIAN:  Suanne Marker, MD Certified in Neurology, Neurophysiology and Neuroimaging  [facility] [address] [place], [place] [address] [phone]

## 2017-09-19 ENCOUNTER — Telehealth: Payer: Self-pay

## 2017-09-19 NOTE — Telephone Encounter (Signed)
Normal EEG. Please call patient. Continue current plan. -VRP   Left a voicemail with the patient letting him know the results of his EEG and the office number in case he had any further questions.

## 2017-09-19 NOTE — Telephone Encounter (Signed)
-----   Message from Suanne Marker, MD sent at 09/15/2017  6:34 PM EDT ----- Normal EEG. Please call patient. Continue current plan. -VRP

## 2017-09-19 NOTE — Telephone Encounter (Signed)
Advised patient's mother Misty Stanley (on [place]) of normal EEG per previous message.Marland Kitchen

## 2018-01-23 ENCOUNTER — Ambulatory Visit: Payer: PRIVATE HEALTH INSURANCE | Admitting: Diagnostic Neuroimaging

## 2018-01-31 ENCOUNTER — Ambulatory Visit
Admission: EM | Admit: 2018-01-31 | Discharge: 2018-01-31 | Disposition: A | Discharge status: Home / Self Care | Payer: 59 | Attending: Emergency Medicine | Admitting: Emergency Medicine

## 2018-01-31 ENCOUNTER — Encounter: Payer: Self-pay | Admitting: Emergency Medicine

## 2018-01-31 DIAGNOSIS — M67431 Ganglion, right wrist: Secondary | ICD-10-CM

## 2018-01-31 NOTE — ED Provider Notes (Signed)
HPI  SUBJECTIVE:  Bryan Mendez is a right-handed 23 y.o. male who presents with a painless growth on the back of his right wrist starting a week ago.  He denies pain with any wrist range of motion.  He denies trauma to the wrist, repeated activity with this wrist, redness, fevers, numbness or tingling in his fingers.  He tried ice without improvement of symptoms.  No aggravating factors.  Past medical history negative for diabetes, hypertension.  PMD: None.  History reviewed. No pertinent past medical history.  Past Surgical History:  Procedure Laterality Date  . KNEE SURGERY Right     Family History  Problem Relation Age of Onset  . Epilepsy Mother   . Hypertension Father   . Diabetes Maternal Grandmother     Social History   Tobacco Use  . Smoking status: Never Smoker  . Smokeless tobacco: Never Used  Substance Use Topics  . Alcohol use: Never    Frequency: Never  . Drug use: Never    No current facility-administered medications for this encounter.   Current Outpatient Medications:  Marland Kitchen  UNABLE TO FIND, Med Name: whey protein powder, Disp: , Rfl:   No Known Allergies   ROS  As noted in HPI.   Physical Exam  BP 124/79 (BP Location: Left Arm)   Pulse 78   Temp 98.2 F (36.8 C) (Oral)   Resp 16   SpO2 98%   Constitutional: Well developed, well nourished, no acute distress Eyes:  EOMI, conjunctiva normal bilaterally HENT: Normocephalic, atraumatic,mucus membranes moist Respiratory: Normal inspiratory effort Cardiovascular: Normal rate GI: nondistended skin: No rash, skin intact Musculoskeletal: Nontender swelling dorsum right wrist consistent with a ganglion cyst.  No erythema.  Patient has full painless wrist range of motion.  He is neurovascularaly intact in the median/radial/ulnar distribution.  RP 2+.  No pain with supination, pronation.  No bony tenderness.  Negative Finkelstein. Neurologic: Alert & oriented x 3, no focal neuro deficits Psychiatric:  Speech and behavior appropriate   ED Course   Medications - No data to display  No orders of the defined types were placed in this encounter.   No results found for this or any previous visit (from the past 24 hour(s)). No results found.  ED Clinical Impression  Ganglion cyst of dorsum of right wrist   ED Assessment/Plan  Patient with a ganglion cyst of the dorsum of the right wrist.  It does not appear to be infected.  He is neurovascularly intact.  Referring to hand for definitive treatment.  Dr. Derl Barrow on call..  Discussed MDM, treatment plan, and plan for follow-up with patient.  patient agrees with plan.   No orders of the defined types were placed in this encounter.   *This clinic note was created using Dragon dictation software. Therefore, there may be occasional mistakes despite careful proofreading.   ?   Domenick Gong, MD 01/31/18 [phone]

## 2018-01-31 NOTE — Discharge Instructions (Signed)
Follow-up with Dr. Amanda Pea or PA Tinnie Gens for definitive treatment.  Go immediately to the ER for fevers above 100.4, if it turns red, if you have pain with moving her wrist, or for any other concerns.

## 2018-01-31 NOTE — ED Triage Notes (Signed)
Pt presents to [facility] for assessment of growth to right wrist x 2 weeks.  Denies pain or decreased ROM

## 2018-01-31 NOTE — ED Notes (Signed)
Patient able to ambulate independently  

## 2019-03-15 ENCOUNTER — Ambulatory Visit: Payer: 59 | Attending: Family

## 2019-03-15 DIAGNOSIS — Z23 Encounter for immunization: Secondary | ICD-10-CM | POA: Insufficient documentation

## 2019-03-15 NOTE — Progress Notes (Signed)
   Covid-19 Vaccination Clinic  Name:  Adel Burch    MRN: [id number] DOB: 1995/09/23  03/15/2019  Mr. Gallicchio was observed post Covid-19 immunization for 15 minutes without incident. He was provided with Vaccine Information Sheet and instruction to access the V-Safe system.   Mr. Pattillo was instructed to call 911 with any severe reactions post vaccine: Marland Kitchen Difficulty breathing  . Swelling of face and throat  . A fast heartbeat  . A bad rash all over body  . Dizziness and weakness   Immunizations Administered    Name Date Dose VIS Date Route   Moderna COVID-19 Vaccine 03/15/2019  3:22 PM 0.5 mL 12/12/2018 Intramuscular   Manufacturer: Moderna   Lot: [id number]   NDC: 28786-767-20

## 2019-04-17 ENCOUNTER — Ambulatory Visit: Payer: 59 | Attending: Family

## 2019-04-17 DIAGNOSIS — Z23 Encounter for immunization: Secondary | ICD-10-CM

## 2019-04-17 NOTE — Progress Notes (Signed)
   Covid-19 Vaccination Clinic  Name:  Bryan Mendez    MRN: [id number] DOB: 1995/09/15  04/17/2019  [name] was observed post Covid-19 immunization for 15 minutes without incident. He was provided with Vaccine Information Sheet and instruction to access the V-Safe system.   [name] was instructed to call 911 with any severe reactions post vaccine: Marland Kitchen Difficulty breathing  . Swelling of face and throat  . A fast heartbeat  . A bad rash all over body  . Dizziness and weakness   Immunizations Administered    Name Date Dose VIS Date Route   Moderna COVID-19 Vaccine 04/17/2019 11:08 AM 0.5 mL 12/12/2018 Intramuscular   Manufacturer: Moderna   Lot: [id number]   NDC: 82500-370-48

## 2020-01-24 ENCOUNTER — Ambulatory Visit: Payer: 59 | Attending: Family

## 2020-01-24 DIAGNOSIS — Z23 Encounter for immunization: Secondary | ICD-10-CM

## 2020-01-26 IMAGING — MR MR CERVICAL SPINE W/O CM
5 series · 38 of 48 positions shown · non-contrast
Comparison: MRI head September 12, 2017

CLINICAL DATA: RIGHT extremity weakness and numbness.  Headache.

EXAM:
MRI CERVICAL SPINE WITHOUT CONTRAST
TECHNIQUE: Multiplanar, multisequence MR imaging of the cervical spine was
performed. No intravenous contrast was administered.

[Series 5: T2 · sagittal · 3.0mm · 0.69mm/px · 6 of 15 slices shown (1 of 2)]
[im 1/15]
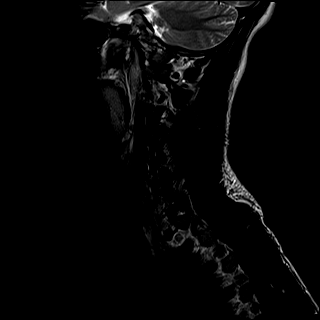
[im 3/15]
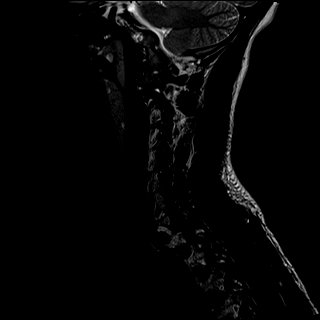
[im 6/15]
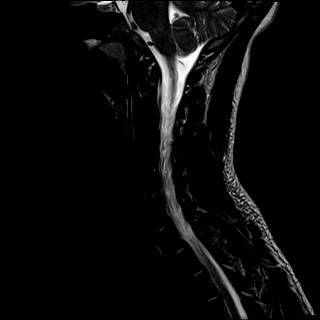
[im 9/15]
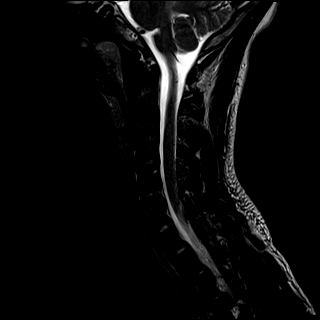
[im 12/15]
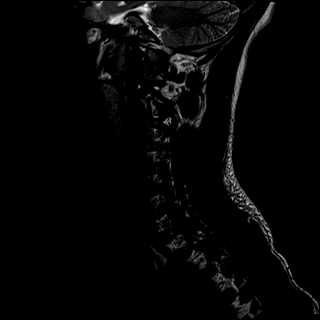
[im 15/15]
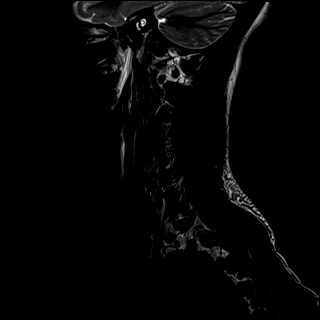

[Series 6: T1 · sagittal · 3.0mm · 0.69mm/px · 6 of 15 slices shown]
[im 1/15]
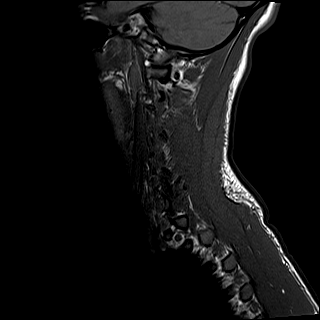
[im 3/15]
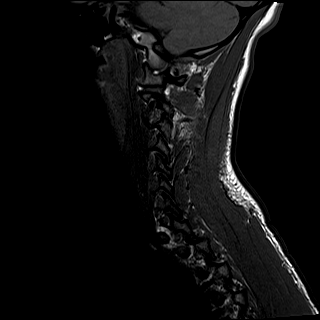
[im 6/15]
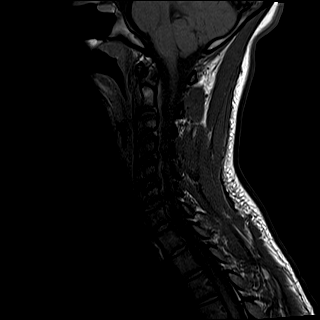
[im 9/15]
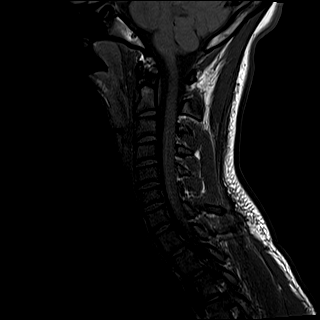
[im 12/15]
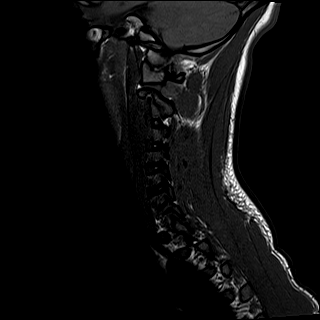
[im 15/15]
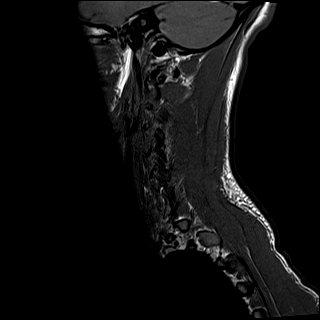

[Series 7: STIR · sagittal · 3.0mm · 0.86mm/px · 6 of 15 slices shown]
[im 1/15]
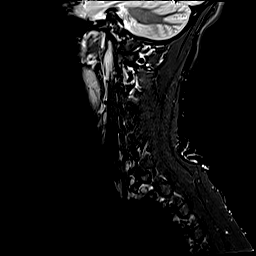
[im 3/15]
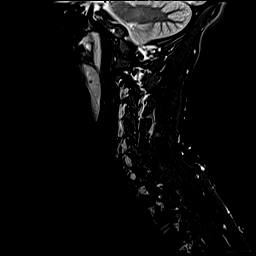
[im 6/15]
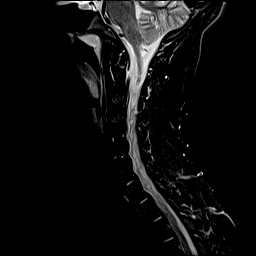
[im 9/15]
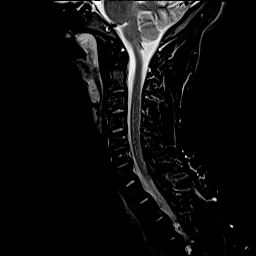
[im 12/15]
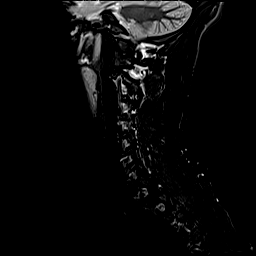
[im 15/15]
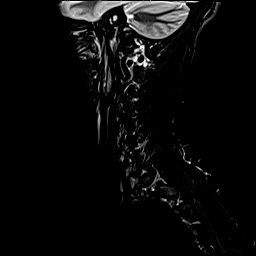

[Series 8: T2 · axial · 3.0mm · 0.66mm/px · z∈[-148,-20]mm · 12 of 40 slices shown (2 of 2)]
[im 1/40]
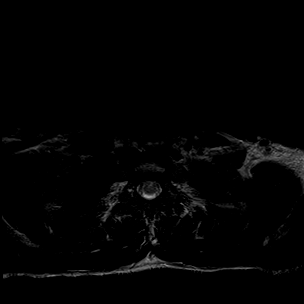
[im 3/40]
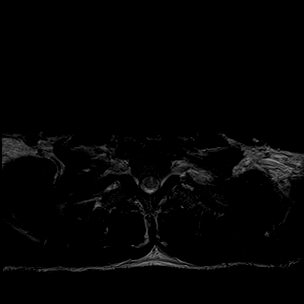
[im 6/40]
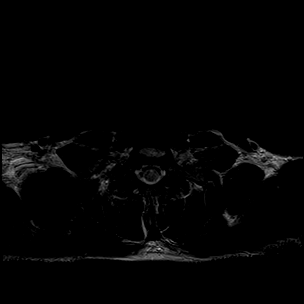
[im 9/40]
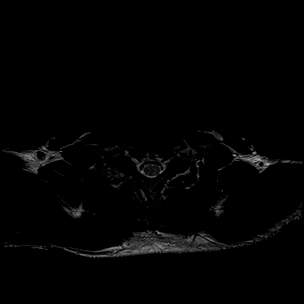
[im 12/40]
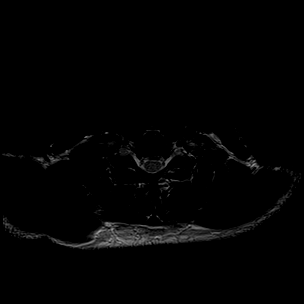
[im 14/40]
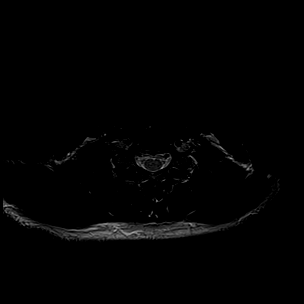
[im 17/40]
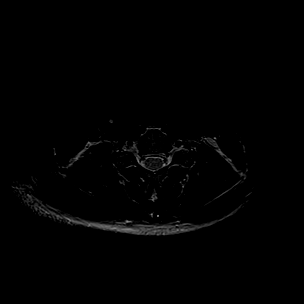
[im 20/40]
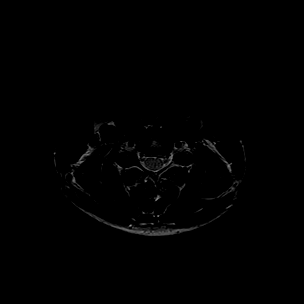
[im 23/40]
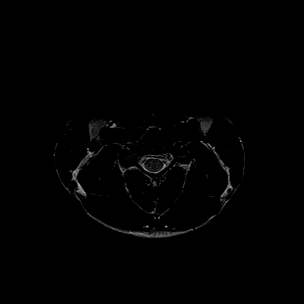
[im 28/40]
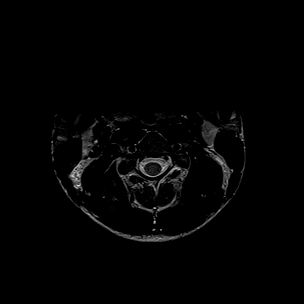
[im 34/40]
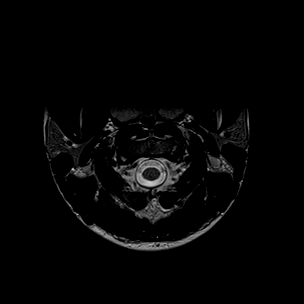
[im 40/40]
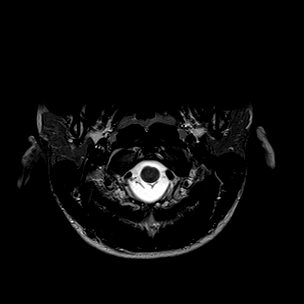

[Series 9: GRE · axial · 3.0mm · 0.39mm/px · z∈[-148,-20]mm · 8 of 40 slices shown]
[im 1/40]
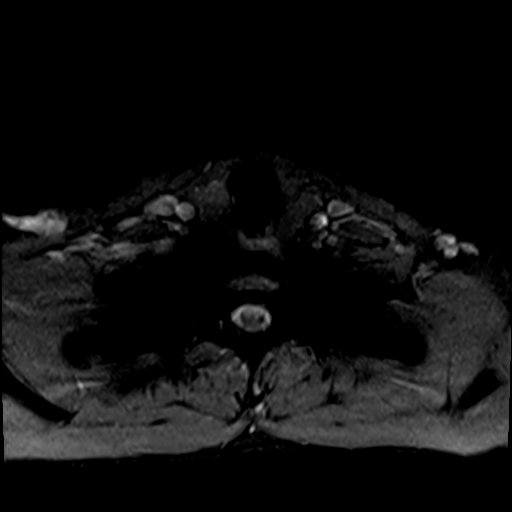
[im 6/40]
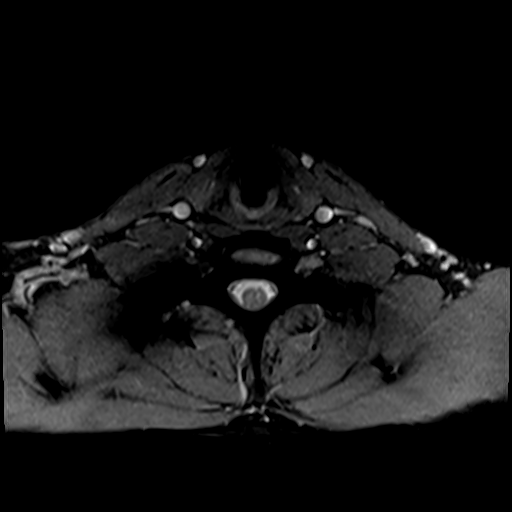
[im 12/40]
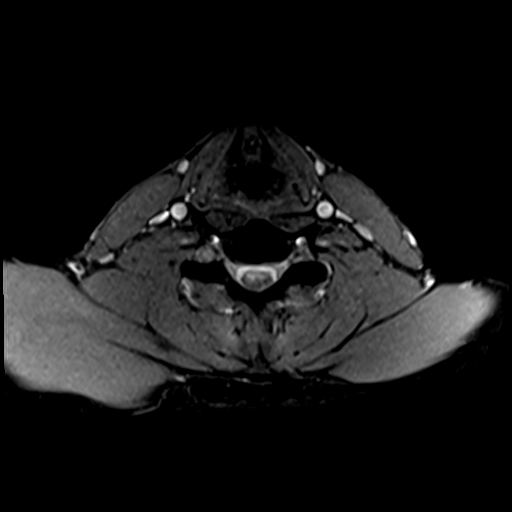
[im 17/40]
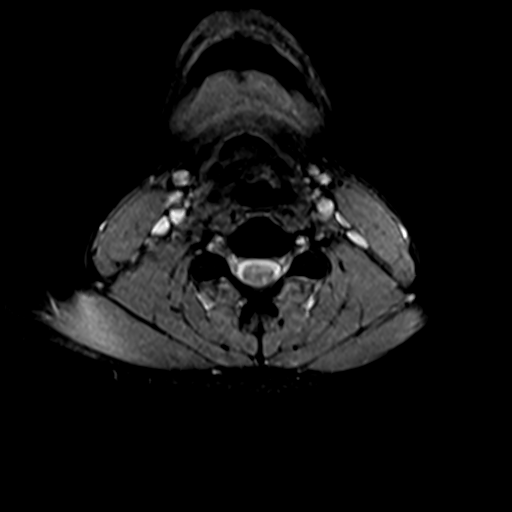
[im 23/40]
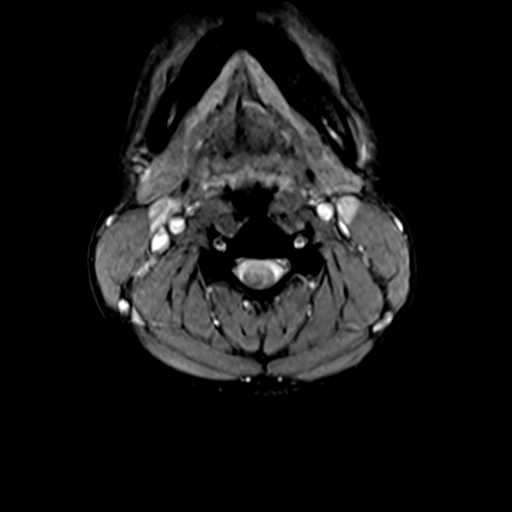
[im 28/40]
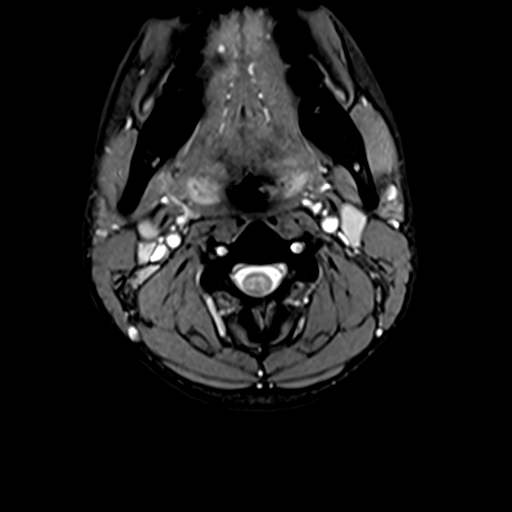
[im 34/40]
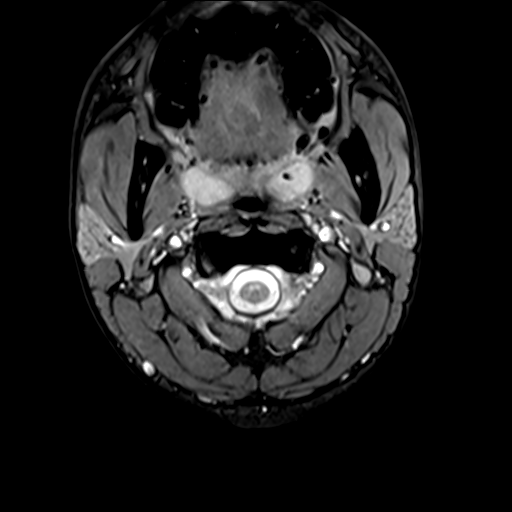
[im 40/40]
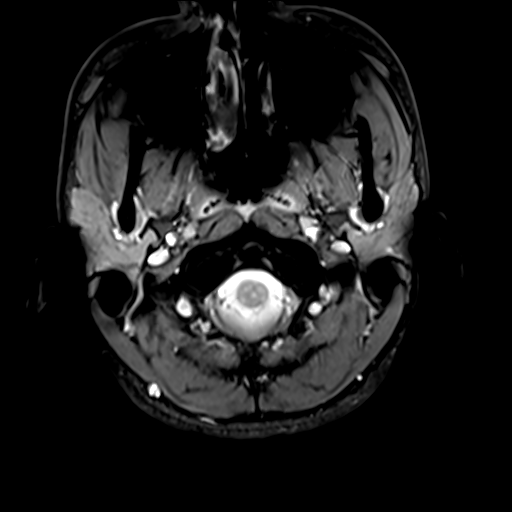

[38 of 48 positions shown; findings below may reference images not displayed]

FINDINGS: ALIGNMENT: Straightened cervical lordosis.  No malalignment.

VERTEBRAE/DISCS: Vertebral bodies are intact. Intervertebral disc
morphology's and signal are normal.

CORD:Cervical spinal cord is normal morphology and signal
characteristics from the cervicomedullary junction to level of T3-4,
the most caudal well visualized level.

POSTERIOR FOSSA, VERTEBRAL ARTERIES, PARASPINAL TISSUES: No MR
findings of ligamentous injury. Vertebral artery flow voids present.
Included posterior fossa and paraspinal soft tissues are normal.

DISC LEVELS:

C2-3 and C3-4: No disc bulge, canal stenosis nor neural foraminal
narrowing.

C4-5: Uncovertebral hypertrophy. No canal stenosis. Mild RIGHT
neural foraminal narrowing.

C5-6 through C7-T1: No disc bulge, canal stenosis nor neural
foraminal narrowing. C5-6 RIGHT central annular fissure.
IMPRESSION: 1. No fracture, malalignment or acute osseous process.
2. No canal stenosis.  C5-6 annular fissure.
3. Mild RIGHT C4-5 neural foraminal narrowing.

## 2020-05-25 NOTE — Progress Notes (Signed)
   Covid-19 Vaccination Clinic  Name:  Daeveon Zweber    MRN: [id number] DOB: August 04, 1995  05/25/2020  Mr. Sharman was observed post Covid-19 immunization for 15 minutes without incident. He was provided with Vaccine Information Sheet and instruction to access the V-Safe system.   Mr. Donahoe was instructed to call 911 with any severe reactions post vaccine: Marland Kitchen Difficulty breathing  . Swelling of face and throat  . A fast heartbeat  . A bad rash all over body  . Dizziness and weakness   Immunizations Administered    Name Date Dose VIS Date Route   Moderna Covid-19 Booster Vaccine 01/24/2020 11:15 AM 0.25 mL 10/31/2019 Intramuscular   Manufacturer: Moderna   Lot: [id number]   NDC: 19622-297-98

## 2020-08-07 ENCOUNTER — Other Ambulatory Visit: Payer: Self-pay

## 2020-08-07 ENCOUNTER — Emergency Department (HOSPITAL_COMMUNITY): Payer: 59

## 2020-08-07 ENCOUNTER — Emergency Department (HOSPITAL_COMMUNITY)
Admission: EM | Admit: 2020-08-07 | Discharge: 2020-08-07 | Disposition: A | Discharge status: Home / Self Care | Payer: 59 | Attending: Emergency Medicine | Admitting: Emergency Medicine

## 2020-08-07 ENCOUNTER — Encounter (HOSPITAL_COMMUNITY): Payer: Self-pay

## 2020-08-07 DIAGNOSIS — Y92410 Unspecified street and highway as the place of occurrence of the external cause: Secondary | ICD-10-CM | POA: Insufficient documentation

## 2020-08-07 DIAGNOSIS — F17210 Nicotine dependence, cigarettes, uncomplicated: Secondary | ICD-10-CM | POA: Diagnosis not present

## 2020-08-07 DIAGNOSIS — M542 Cervicalgia: Secondary | ICD-10-CM | POA: Insufficient documentation

## 2020-08-07 DIAGNOSIS — R519 Headache, unspecified: Secondary | ICD-10-CM | POA: Diagnosis not present

## 2020-08-07 MED ORDER — METHOCARBAMOL 500 MG PO TABS: 500.0000 mg | ORAL_TABLET | Freq: Two times a day (BID) | ORAL | 0 refills | Status: AC
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
## 2020-08-07 NOTE — Discharge Instructions (Addendum)
  You were evaluated in the Emergency Department and after careful evaluation, we did not find any emergent condition requiring admission or further testing in the hospital.   Your exam/testing today was overall reassuring.  Your CT scans were reassuring.  As we discussed you will have muscle soreness for the next 48 hours, please take Tylenol or ibuprofen as directed on bottle for pain and I also prescribed you Robaxin which is a muscle relaxant.  Do not operate heavy machinery, drive or drink alcohol when taking this medication.  If you have any new worsening concerning symptoms such as vision changes, weakness, numbness or tingling or difficulty walking please come into the ER.  Please use the attached instructions. Please return to the Emergency Department if you experience any worsening of your condition.  Thank you for allowing [place] to be a part of your care. Please speak to your pharmacist about any new medications prescribed today in regards to side effects or interactions with other medications.
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
